# Patient Record
Sex: Male | Born: 1960 | Hispanic: No | Marital: Married | Smoking: Never smoker
Health system: Southern US, Community
[De-identification: ages and names within clinical notes are randomized; demographics above are authoritative.]

## PROBLEM LIST (undated history)

## (undated) DIAGNOSIS — N400 Enlarged prostate without lower urinary tract symptoms: Secondary | ICD-10-CM

## (undated) DIAGNOSIS — N529 Male erectile dysfunction, unspecified: Secondary | ICD-10-CM

## (undated) DIAGNOSIS — Z125 Encounter for screening for malignant neoplasm of prostate: Secondary | ICD-10-CM

## (undated) DIAGNOSIS — R002 Palpitations: Secondary | ICD-10-CM

## (undated) DIAGNOSIS — R011 Cardiac murmur, unspecified: Secondary | ICD-10-CM

## (undated) HISTORY — DX: Cardiac murmur, unspecified: R01.1

## (undated) HISTORY — PX: NASAL POLYP EXCISION: SHX2068

## (undated) HISTORY — DX: Palpitations: R00.2

## (undated) HISTORY — DX: Benign prostatic hyperplasia without lower urinary tract symptoms: N40.0

## (undated) HISTORY — DX: Male erectile dysfunction, unspecified: N52.9

## (undated) HISTORY — DX: Encounter for screening for malignant neoplasm of prostate: Z12.5

---

## 2004-05-11 ENCOUNTER — Ambulatory Visit (HOSPITAL_BASED_OUTPATIENT_CLINIC_OR_DEPARTMENT_OTHER): Admission: RE | Admit: 2004-05-11 | Discharge: 2004-05-11 | Payer: Self-pay | Admitting: *Deleted

## 2004-05-11 ENCOUNTER — Ambulatory Visit (HOSPITAL_COMMUNITY): Admission: RE | Admit: 2004-05-11 | Discharge: 2004-05-11 | Payer: Self-pay | Admitting: *Deleted

## 2012-10-19 ENCOUNTER — Encounter: Payer: Self-pay | Admitting: Internal Medicine

## 2012-11-09 ENCOUNTER — Encounter: Payer: Self-pay | Admitting: Internal Medicine

## 2012-11-09 ENCOUNTER — Ambulatory Visit (INDEPENDENT_AMBULATORY_CARE_PROVIDER_SITE_OTHER): Payer: 59 | Admitting: Internal Medicine

## 2012-11-09 ENCOUNTER — Ambulatory Visit: Payer: 59

## 2012-11-09 VITALS — BP 122/66 | HR 70 | Temp 97.9°F | Resp 16 | Ht 71.5 in | Wt 163.5 lb

## 2012-11-09 DIAGNOSIS — Z0000 Encounter for general adult medical examination without abnormal findings: Secondary | ICD-10-CM

## 2012-11-09 DIAGNOSIS — K921 Melena: Secondary | ICD-10-CM

## 2012-11-09 DIAGNOSIS — I341 Nonrheumatic mitral (valve) prolapse: Secondary | ICD-10-CM

## 2012-11-09 DIAGNOSIS — M546 Pain in thoracic spine: Secondary | ICD-10-CM

## 2012-11-09 DIAGNOSIS — I2510 Atherosclerotic heart disease of native coronary artery without angina pectoris: Secondary | ICD-10-CM

## 2012-11-09 LAB — LIPID PANEL
Cholesterol: 204 mg/dL — ABNORMAL HIGH (ref 0–200)
HDL: 51 mg/dL (ref >39)
Total CHOL/HDL Ratio: 4 Ratio
Triglycerides: 86 mg/dL (ref <150)
VLDL: 17 mg/dL (ref 0–40)

## 2012-11-09 LAB — COMPREHENSIVE METABOLIC PANEL
AST: 22 U/L (ref 0–37)
Albumin: 4.8 g/dL (ref 3.5–5.2)
Alkaline Phosphatase: 82 U/L (ref 39–117)
BUN: 15 mg/dL (ref 6–23)
Creat: 0.85 mg/dL (ref 0.50–1.35)
Glucose, Bld: 97 mg/dL (ref 70–99)
Potassium: 4.2 mEq/L (ref 3.5–5.3)
Total Bilirubin: 0.9 mg/dL (ref 0.3–1.2)

## 2012-11-09 LAB — PSA: PSA: 2.33 ng/mL (ref <=4.00)

## 2012-11-09 LAB — CBC WITH DIFFERENTIAL/PLATELET
Basophils Absolute: 0 10*3/uL (ref 0.0–0.1)
Basophils Relative: 0 % (ref 0–1)
Eosinophils Absolute: 0.1 10*3/uL (ref 0.0–0.7)
HCT: 42.7 % (ref 39.0–52.0)
MCH: 33.3 pg (ref 26.0–34.0)
MCHC: 35.4 g/dL (ref 30.0–36.0)
Monocytes Absolute: 0.6 10*3/uL (ref 0.1–1.0)
Monocytes Relative: 6 % (ref 3–12)
Neutro Abs: 7.6 10*3/uL (ref 1.7–7.7)
RDW: 13 % (ref 11.5–15.5)

## 2012-11-09 LAB — POCT URINALYSIS DIPSTICK
Bilirubin, UA: NEGATIVE
Blood, UA: NEGATIVE
Leukocytes, UA: NEGATIVE
Nitrite, UA: NEGATIVE
Protein, UA: NEGATIVE
pH, UA: 8.5

## 2012-11-09 LAB — POCT UA - MICROSCOPIC ONLY
Casts, Ur, LPF, POC: NEGATIVE
Crystals, Ur, HPF, POC: NEGATIVE
Yeast, UA: NEGATIVE

## 2012-11-09 NOTE — Progress Notes (Signed)
Subjective:    Patient ID: Dillon Morgan, male    DOB: Sep 17, 1961, 51 y.o.   MRN: [id number]  HPI Feels good, needs cpe and never had colonoscopy. Has an chalazion needs removal Has occ mid thorax pain when he wakes up in am, goes away with activity. Wife says he cant hear.   Review of Systems  Constitutional: Negative.   HENT: Negative.   Eyes: Negative.   Respiratory: Negative.   Cardiovascular: Negative.   Gastrointestinal: Negative.   Genitourinary: Negative.   Musculoskeletal: Negative.   Neurological: Negative.   Hematological: Negative.   Psychiatric/Behavioral: Negative.        Objective:   Physical Exam  Vitals reviewed. Constitutional: He is oriented to person, place, and time. He appears well-developed and well-nourished.  HENT:  Right Ear: External ear normal.  Left Ear: External ear normal.  Nose: Nose normal.  Mouth/Throat: Oropharynx is clear and moist.  Eyes: EOM are normal. Pupils are equal, round, and reactive to light. Right eye exhibits hordeolum. No scleral icterus.  Neck: Normal range of motion. No tracheal deviation present. No thyromegaly present.  Cardiovascular: Normal rate, regular rhythm and normal pulses.   No murmur heard.      Has click  Pulmonary/Chest: Effort normal.  Abdominal: Soft. He exhibits no mass. There is no tenderness.  Genitourinary: Rectum normal, prostate normal and penis normal.  Musculoskeletal: Normal range of motion.  Lymphadenopathy:    He has no cervical adenopathy.  Neurological: He is alert and oriented to person, place, and time. He has normal reflexes.  Skin: Skin is warm and dry.  Psychiatric: He has a normal mood and affect. His behavior is normal.   Results for orders placed in visit on 11/09/12  POCT UA - MICROSCOPIC ONLY      Component Value Range   WBC, Ur, HPF, POC 0-2     RBC, urine, microscopic 0-2     Bacteria, U Microscopic neg     Mucus, UA large     Epithelial cells, urine per micros 0-5     Crystals, Ur, HPF, POC neg     Casts, Ur, LPF, POC neg     Yeast, UA neg    POCT URINALYSIS DIPSTICK      Component Value Range   Color, UA yellow     Clarity, UA clear     Glucose, UA neg     Bilirubin, UA neg     Ketones, UA neg     Spec Grav, UA 1.020     Blood, UA neg     pH, UA 8.5     Protein, UA neg     Urobilinogen, UA 0.2     Nitrite, UA neg     Leukocytes, UA Negative     [facility] reading (PRIMARY) by  Dr.Keven Osborn cxr appears normal, ?cause of thorax back pain  Results for orders placed in visit on 11/09/12  IFOBT (OCCULT BLOOD)      Component Value Range   IFOBT Positive    POCT UA - MICROSCOPIC ONLY      Component Value Range   WBC, Ur, HPF, POC 0-2     RBC, urine, microscopic 0-2     Bacteria, U Microscopic neg     Mucus, UA large     Epithelial cells, urine per micros 0-5     Crystals, Ur, HPF, POC neg     Casts, Ur, LPF, POC neg     Yeast, UA neg  POCT URINALYSIS DIPSTICK      Component Value Range   Color, UA yellow     Clarity, UA clear     Glucose, UA neg     Bilirubin, UA neg     Ketones, UA neg     Spec Grav, UA 1.020     Blood, UA neg     pH, UA 8.5     Protein, UA neg     Urobilinogen, UA 0.2     Nitrite, UA neg     Leukocytes, UA Negative     Heme pos.       Assessment & Plan:  Colonocopy ordered Refuses flu shot RF to opthalmology for eyelid lesion

## 2012-11-09 NOTE — Patient Instructions (Addendum)
Chalazion A chalazion is a swelling or hard lump on the eyelid caused by a blocked oil gland. Chalazions may occur on the upper or the lower eyelid.  CAUSES  Oil gland in the eyelid becomes blocked. SYMPTOMS   Swelling or hard lump on the eyelid. This lump may make it hard to see out of the eye.  The swelling may spread to areas around the eye. TREATMENT   Although some chalazions disappear by themselves in 1 or 2 months, some chalazions may need to be removed.  Medicines to treat an infection may be required. HOME CARE INSTRUCTIONS   Wash your hands often and dry them with a clean towel. Do not touch the chalazion.  Apply heat to the eyelid several times a day for 10 minutes to help ease discomfort and bring any yellowish white fluid (pus) to the surface. One way to apply heat to a chalazion is to use the handle of a metal spoon.  Hold the handle under hot water until it is hot, and then wrap the handle in paper towels so that the heat can come through without burning your skin.  Hold the wrapped handle against the chalazion and reheat the spoon handle as needed.  Apply heat in this fashion for 10 minutes, 4 times per day.  Return to your caregiver to have the pus removed if it does not break (rupture) on its own.  Do not try to remove the pus yourself by squeezing the chalazion or sticking it with a pin or needle.  Only take over-the-counter or prescription medicines for pain, discomfort, or fever as directed by your caregiver. SEEK IMMEDIATE MEDICAL CARE IF:   You have pain in your eye.  Your vision changes.  The chalazion does not go away.  The chalazion becomes painful, red, or swollen, grows larger, or does not start to disappear after 2 weeks. MAKE SURE YOU:   Understand these instructions.  Will watch your condition.  Will get help right away if you are not doing well or get worse. Document Released: 11/29/2000 Document Revised: 02/24/2012 Document Reviewed:  03/19/2010 [facility] Patient Information 2013 [place], [place]. Rectal Bleeding Rectal bleeding is when blood passes out of the anus. It is usually a sign that something is wrong. It may not be serious, but it should always be evaluated. Rectal bleeding may present as bright red blood or extremely dark stools. The color may range from dark red or maroon to black (like tar). It is important that the cause of rectal bleeding be identified so treatment can be started and the problem corrected. CAUSES   Hemorrhoids. These are enlarged (dilated) blood vessels or veins in the anal or rectal area.  Fistulas. Theseare abnormal, burrowing channels that usually run from inside the rectum to the skin around the anus. They can bleed.  Anal fissures. This is a tear in the tissue of the anus. Bleeding occurs with bowel movements.  Diverticulosis. This is a condition in which pockets or sacs project from the bowel wall. Occasionally, the sacs can bleed.  Diverticulitis. Thisis an infection involving diverticulosis of the colon.  Proctitis and colitis. These are conditions in which the rectum, colon, or both, can become inflamed and pitted (ulcerated).  Polyps and cancer. Polyps are non-cancerous (benign) growths in the colon that may bleed. Certain types of polyps turn into cancer.  Protrusion of the rectum. Part of the rectum can project from the anus and bleed.  Certain medicines.  Intestinal infections.  Blood vessel  abnormalities. HOME CARE INSTRUCTIONS  Eat a high-fiber diet to keep your stool soft.  Limit activity.  Drink enough fluids to keep your urine clear or pale yellow.  Warm baths may be useful to soothe rectal pain.  Follow up with your caregiver as directed. SEEK IMMEDIATE MEDICAL CARE IF:  You develop increased bleeding.  You have black or dark red stools.  You vomit blood or material that looks like coffee grounds.  You have abdominal pain or tenderness.  You have a  fever.  You feel weak, nauseous, or you faint.  You have severe rectal pain or you are unable to have a bowel movement. MAKE SURE YOU:  Understand these instructions.  Will watch your condition.  Will get help right away if you are not doing well or get worse. Document Released: 05/24/2002 Document Revised: 02/24/2012 Document Reviewed: 05/19/2011 [facility] Patient Information 2013 [place], [place].

## 2012-11-10 ENCOUNTER — Encounter: Payer: Self-pay | Admitting: Internal Medicine

## 2012-12-03 ENCOUNTER — Encounter: Payer: Self-pay | Admitting: Internal Medicine

## 2012-12-11 ENCOUNTER — Encounter: Payer: Self-pay | Admitting: Internal Medicine

## 2012-12-11 ENCOUNTER — Ambulatory Visit (INDEPENDENT_AMBULATORY_CARE_PROVIDER_SITE_OTHER): Payer: 59 | Admitting: Internal Medicine

## 2012-12-11 VITALS — BP 100/64 | HR 68 | Ht 71.5 in | Wt 161.4 lb

## 2012-12-11 DIAGNOSIS — Z8709 Personal history of other diseases of the respiratory system: Secondary | ICD-10-CM | POA: Insufficient documentation

## 2012-12-11 DIAGNOSIS — R195 Other fecal abnormalities: Secondary | ICD-10-CM

## 2012-12-11 DIAGNOSIS — K625 Hemorrhage of anus and rectum: Secondary | ICD-10-CM

## 2012-12-11 DIAGNOSIS — Z1211 Encounter for screening for malignant neoplasm of colon: Secondary | ICD-10-CM

## 2012-12-11 MED ORDER — PEG-KCL-NACL-NASULF-NA ASC-C 100 G PO SOLR: 1.0000 | Freq: Once | ORAL | Status: DC

## 2012-12-11 NOTE — Patient Instructions (Addendum)
You have been scheduled for a colonoscopy with propofol. Please follow written instructions given to you at your visit today.  Please pick up your prep kit at the pharmacy within the next 1-3 days. If you use inhalers (even only as needed) or a CPAP machine, please bring them with you on the day of your procedure.  We have sent the following medications to your pharmacy for you to pick up at your convenience: Moviprep; you were given instructions at your office visit today.

## 2012-12-11 NOTE — Progress Notes (Signed)
Patient ID: Dillon Morgan, male   DOB: May 30, 1961, 51 y.o.   MRN: [id number]  SUBJECTIVE: HPI This or other is a 51 year old male with little past medical history who is seen in consultation at the request of Dr. Perrin Maltese for evaluation of rectal bleeding and screening colonoscopy. The patient is alone today. The patient reports that he had an episode of bright red blood per rectum in August 2013. This was an isolated event and painless. He has seen prior streaks of red blood in his stool prior to the episode of more large volume rectal bleeding. He has not seen subsequent blood in his stool, but was found to have a positive FOBT by his PCP. He reports a long history of what he felt to be IBS. He reports in his 29s he was having one bowel movement per week, and then over the last 12 years he reported a cycle of intermittent looser stools with periods of harder stools. He did add lactobacillus in yogurt to his diet which she reports helped, and then a formal probiotic which has helped significantly. He reports his bowel habits are now regular happening 3-4 times per week without hard stool or diarrhea. He denies abdominal pain. Reports a good appetite and stable weight. No heartburn, dysphagia or odynophagia. He has never had colonoscopy.  Review of Systems  As per history of present illness, otherwise negative   History reviewed. No pertinent past medical history.  Current Outpatient Prescriptions  Medication Sig Dispense Refill  . Probiotic Product (PROBIOTIC DAILY) CAPS Take by mouth every other day.      . peg 3350 powder (MOVIPREP) 100 G SOLR Take 1 kit (100 g total) by mouth once.  1 kit  0    Allergies  Allergen Reactions  . Ampicillin Rash    Family History  Problem Relation Age of Onset  . Heart disease Paternal Grandfather   . Heart disease Paternal Uncle     History  Substance Use Topics  . Smoking status: Never Smoker   . Smokeless tobacco: Never Used  . Alcohol Use: No     OBJECTIVE: BP 100/64  Pulse 68  Ht 5' 11.5" (1.816 m)  Wt 161 lb 6.4 oz (73.211 kg)  BMI 22.20 kg/m2 Constitutional: Well-developed and well-nourished. No distress. HEENT: Normocephalic and atraumatic. Oropharynx is clear and moist. No oropharyngeal exudate. Conjunctivae are normal. No scleral icterus. Neck: Neck supple. Trachea midline. Cardiovascular: Normal rate, regular rhythm and intact distal pulses. No M/R/G Pulmonary/chest: Effort normal and breath sounds normal. No wheezing, rales or rhonchi. Abdominal: Soft, nontender, nondistended. Bowel sounds active throughout. There are no masses palpable. No hepatosplenomegaly. Extremities: no clubbing, cyanosis, or edema Lymphadenopathy: No cervical adenopathy noted. Neurological: Alert and oriented to person place and time. Skin: Skin is warm and dry. No rashes noted. Psychiatric: Normal mood and affect. Behavior is normal.  Labs and Imaging -- CBC    Component Value Date/Time   WBC 10.2 11/09/2012 0900   RBC 4.54 11/09/2012 0900   HGB 15.1 11/09/2012 0900   HCT 42.7 11/09/2012 0900   PLT 206 11/09/2012 0900   MCV 94.1 11/09/2012 0900   MCH 33.3 11/09/2012 0900   MCHC 35.4 11/09/2012 0900   RDW 13.0 11/09/2012 0900   LYMPHSABS 1.8 11/09/2012 0900   MONOABS 0.6 11/09/2012 0900   EOSABS 0.1 11/09/2012 0900   BASOSABS 0.0 11/09/2012 0900      ASSESSMENT AND PLAN: 51 year old male with little past medical history who is seen in consultation  at the request of Dr. Perrin Maltese for evaluation of rectal bleeding and screening colonoscopy.   1.  Rectal bleeding/CRC screening/+FOBT -- I recommended colonoscopy to better evaluate his symptoms, we discussed the test including the risks and benefits in detail and he is agreeable to proceed. His rectal bleeding certainly could be anorectal in nature such as hemorrhoids, but warrants further investigation. It seems that he is not having ongoing bleeding and more normal bowel habits on  probiotic. He will continue probiotic, and further recommendations to be made after colonoscopy.

## 2013-01-29 ENCOUNTER — Encounter: Payer: 59 | Admitting: Internal Medicine

## 2013-02-08 ENCOUNTER — Ambulatory Visit (AMBULATORY_SURGERY_CENTER): Payer: 59 | Admitting: Internal Medicine

## 2013-02-08 ENCOUNTER — Encounter: Payer: Self-pay | Admitting: Internal Medicine

## 2013-02-08 VITALS — BP 117/69 | HR 70 | Temp 96.8°F | Resp 14 | Ht 73.5 in | Wt 161.0 lb

## 2013-02-08 DIAGNOSIS — Z1211 Encounter for screening for malignant neoplasm of colon: Secondary | ICD-10-CM

## 2013-02-08 MED ORDER — SODIUM CHLORIDE 0.9 % IV SOLN: 500.0000 mL | INTRAVENOUS | Status: DC

## 2013-02-08 NOTE — Progress Notes (Signed)
Patient did not experience any of the following events: a burn prior to discharge; a fall within the facility; wrong site/side/patient/procedure/implant event; or a hospital transfer or hospital admission upon discharge from the facility. (G8907)Patient did not have preoperative order for IV antibiotic SSI prophylaxis. (G8918) ewm 

## 2013-02-08 NOTE — Op Note (Signed)
[facility] [address] [place], [address]   COLONOSCOPY PROCEDURE REPORT  PATIENT: Dillon, Morgan  MR#: [id number] BIRTHDATE: 10-15-1961 , 51  yrs. old GENDER: Male ENDOSCOPIST: Beverley Fiedler, MD REFERRED AO:ZHYQM, Chris PROCEDURE DATE:  02/08/2013 PROCEDURE:   Colonoscopy, screening ASA CLASS:   Class II INDICATIONS:average risk screening, Rectal Bleeding, and first colonoscopy. MEDICATIONS: MAC sedation, administered by CRNA, Propofol (Diprivan), Fentanyl-Quick Pick, and Propofol (Diprivan) 330 mg IV   DESCRIPTION OF PROCEDURE:   After the risks benefits and alternatives of the procedure were thoroughly explained, informed consent was obtained.  A digital rectal exam revealed no rectal mass.   The LB [id number]-[id number]  endoscope was introduced through the anus and advanced to the terminal ileum which was intubated for a short distance. No adverse events experienced.   The quality of the prep was Moviprep fair, clearing to good with copious irrigation and lavage.  The instrument was then slowly withdrawn as the colon was fully examined.    COLON FINDINGS: The mucosa appeared normal in the terminal ileum. The colon was redundant.  Manual abdominal counter-pressure and changing to the pediatric colonoscope facilitated intubation to the cecum.   A normal appearing cecum, ileocecal valve, and appendiceal orifice were identified.  The ascending, hepatic flexure, transverse, splenic flexure, descending, sigmoid colon and rectum appeared unremarkable.  No polyps or cancers were seen. Retroflexed views revealed no abnormalities other than skin tags raising the suspicion of prior hemorrhoids.       The scope was withdrawn and the procedure completed.  COMPLICATIONS: There were no complications.  ENDOSCOPIC IMPRESSION: 1.   Normal mucosa in the terminal ileum 2.   The colon was redundant; but normal mucosa throughout the colon  RECOMMENDATIONS: You should  continue to follow colorectal cancer screening guidelines for "routine risk" patients with a repeat colonoscopy in 10 years. There is no need for FOBT (stool) testing for at least 5 years.   eSigned:  Beverley Fiedler, MD 02/08/2013 11:31 AM   cc: Robert Bellow, MD and The Patient

## 2013-02-08 NOTE — Patient Instructions (Addendum)
YOU HAD AN ENDOSCOPIC PROCEDURE TODAY AT THE [facility]: Refer to the procedure report that was given to you for any specific questions about what was found during the examination.  If the procedure report does not answer your questions, please call your gastroenterologist to clarify.  If you requested that your care partner not be given the details of your procedure findings, then the procedure report has been included in a sealed envelope for you to review at your convenience later.  YOU SHOULD EXPECT: Some feelings of bloating in the abdomen. Passage of more gas than usual.  Walking can help get rid of the air that was put into your GI tract during the procedure and reduce the bloating. If you had a lower endoscopy (such as a colonoscopy or flexible sigmoidoscopy) you may notice spotting of blood in your stool or on the toilet paper. If you underwent a bowel prep for your procedure, then you may not have a normal bowel movement for a few days.  DIET: Your first meal following the procedure should be a light meal and then it is ok to progress to your normal diet.  A half-sandwich or bowl of soup is an example of a good first meal.  Heavy or fried foods are harder to digest and may make you feel nauseous or bloated.  Likewise meals heavy in dairy and vegetables can cause extra gas to form and this can also increase the bloating.  Drink plenty of fluids but you should avoid alcoholic beverages for 24 hours.  ACTIVITY: Your care partner should take you home directly after the procedure.  You should plan to take it easy, moving slowly for the rest of the day.  You can resume normal activity the day after the procedure however you should NOT DRIVE or use heavy machinery for 24 hours (because of the sedation medicines used during the test).    SYMPTOMS TO REPORT IMMEDIATELY: A gastroenterologist can be reached at any hour.  During normal business hours, 8:30 AM to 5:00 PM Monday through Friday,  call [phone].  After hours and on weekends, please call the GI answering service at [phone] emergency number  who will take a message and have the physician on call contact you.   Following lower endoscopy (colonoscopy or flexible sigmoidoscopy):  Excessive amounts of blood in the stool  Significant tenderness or worsening of abdominal pains  Swelling of the abdomen that is new, acute  Fever of 100F or higher  FOLLOW UP: If any biopsies were taken you will be contacted by phone or by letter within the next 1-3 weeks.  Call your gastroenterologist if you have not heard about the biopsies in 3 weeks.  Our staff will call the home number listed on your records the next business day following your procedure to check on you and address any questions or concerns that you may have at that time regarding the information given to you following your procedure. This is a courtesy call and so if there is no answer at the home number and we have not heard from you through the emergency physician on call, we will assume that you have returned to your regular daily activities without incident.  SIGNATURES/CONFIDENTIALITY: You and/or your care partner have signed paperwork which will be entered into your electronic medical record.  These signatures attest to the fact that that the information above on your After Visit Summary has been reviewed and is understood.  Full responsibility of the   confidentiality of this discharge information lies with you and/or your care-partner.  Repeat colon 10 years-2024

## 2013-02-09 ENCOUNTER — Telehealth: Payer: Self-pay | Admitting: *Deleted

## 2013-02-09 NOTE — Telephone Encounter (Signed)
  Follow up Call-  Call back number 02/08/2013  Post procedure Call Back phone  # [phone]  Permission to leave phone message Yes     Patient questions:  Do you have a fever, pain , or abdominal swelling? no Pain Score  0 *  Have you tolerated food without any problems? yes  Have you been able to return to your normal activities? yes  Do you have any questions about your discharge instructions: Diet   no Medications  no Follow up visit  no  Do you have questions or concerns about your Care? no  Actions: * If pain score is 4 or above: No action needed, pain <4.

## 2013-09-14 IMAGING — CR DG CHEST 2V
3 series · 3 of 3 positions shown · non-contrast
Comparison: None.

CLINICAL DATA: History of thoracic back pain.

CHEST - 2 VIEW

[PA (1 of 2)]
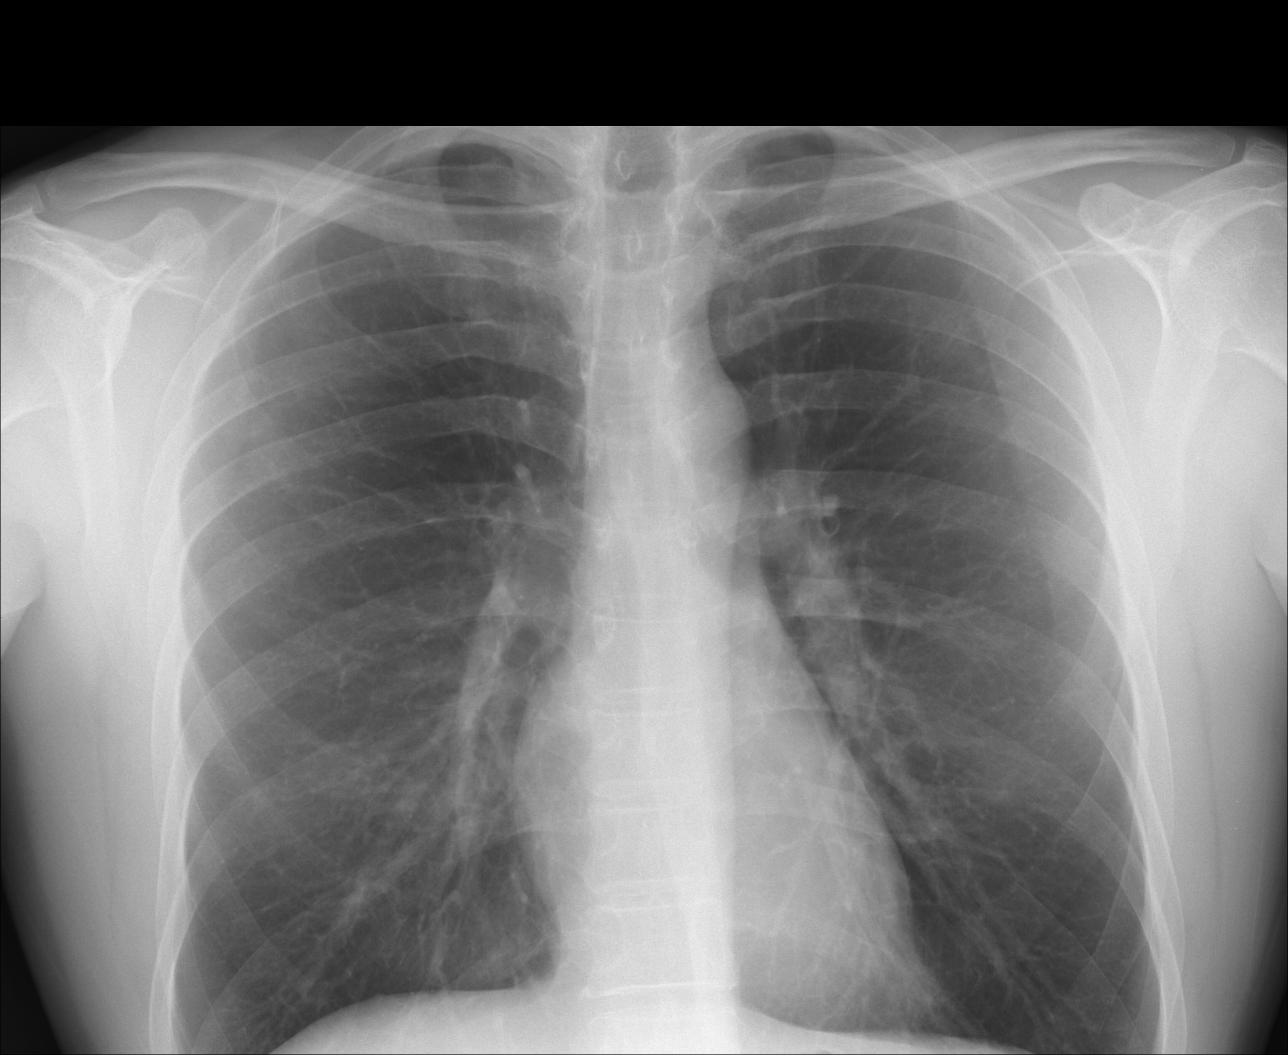

[lateral]
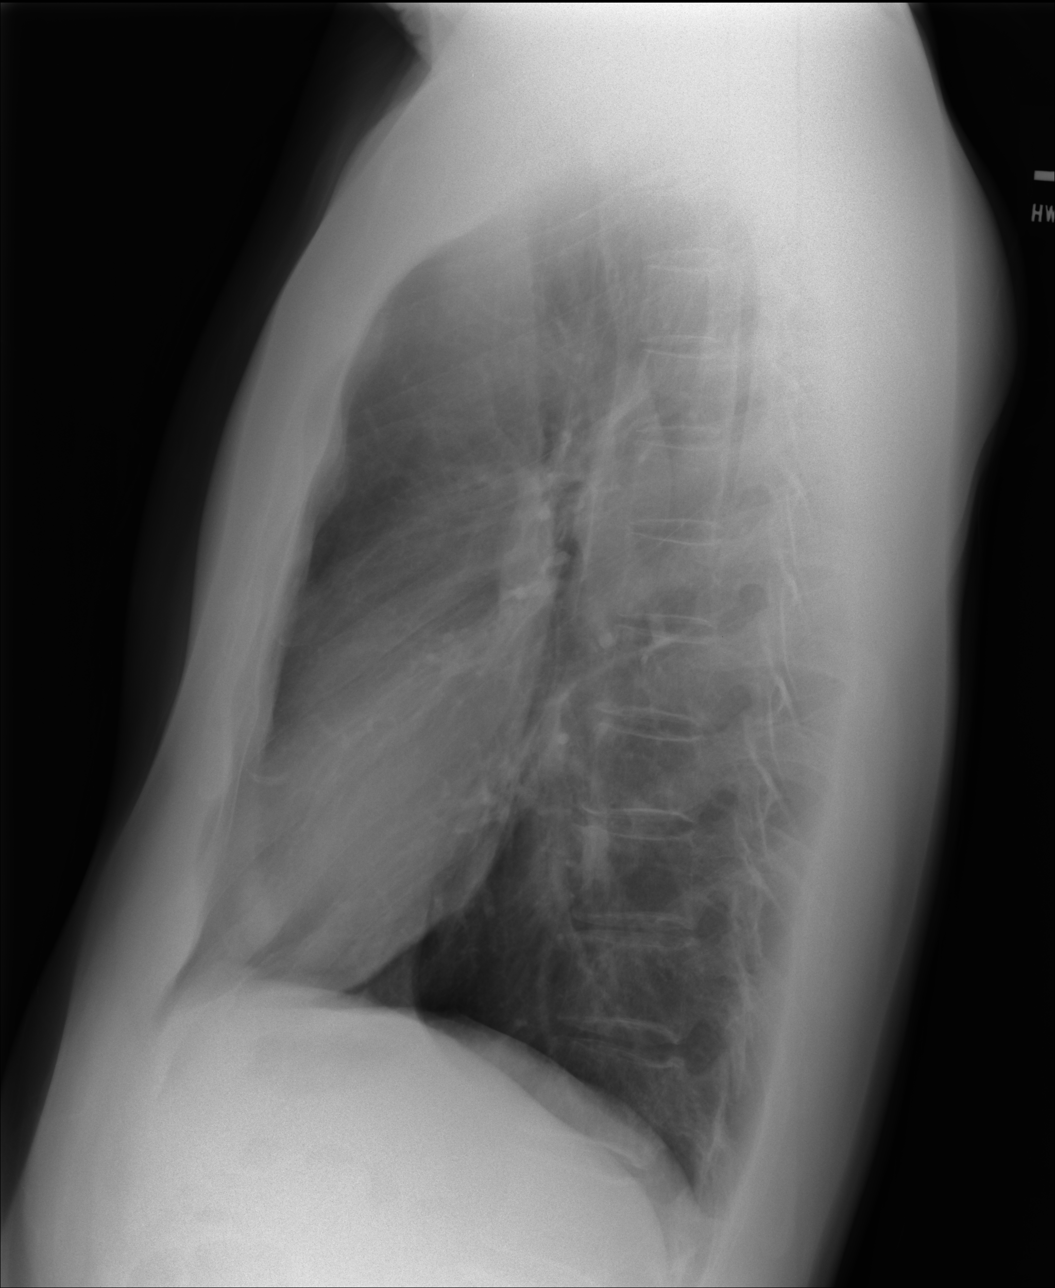

[PA (2 of 2)]
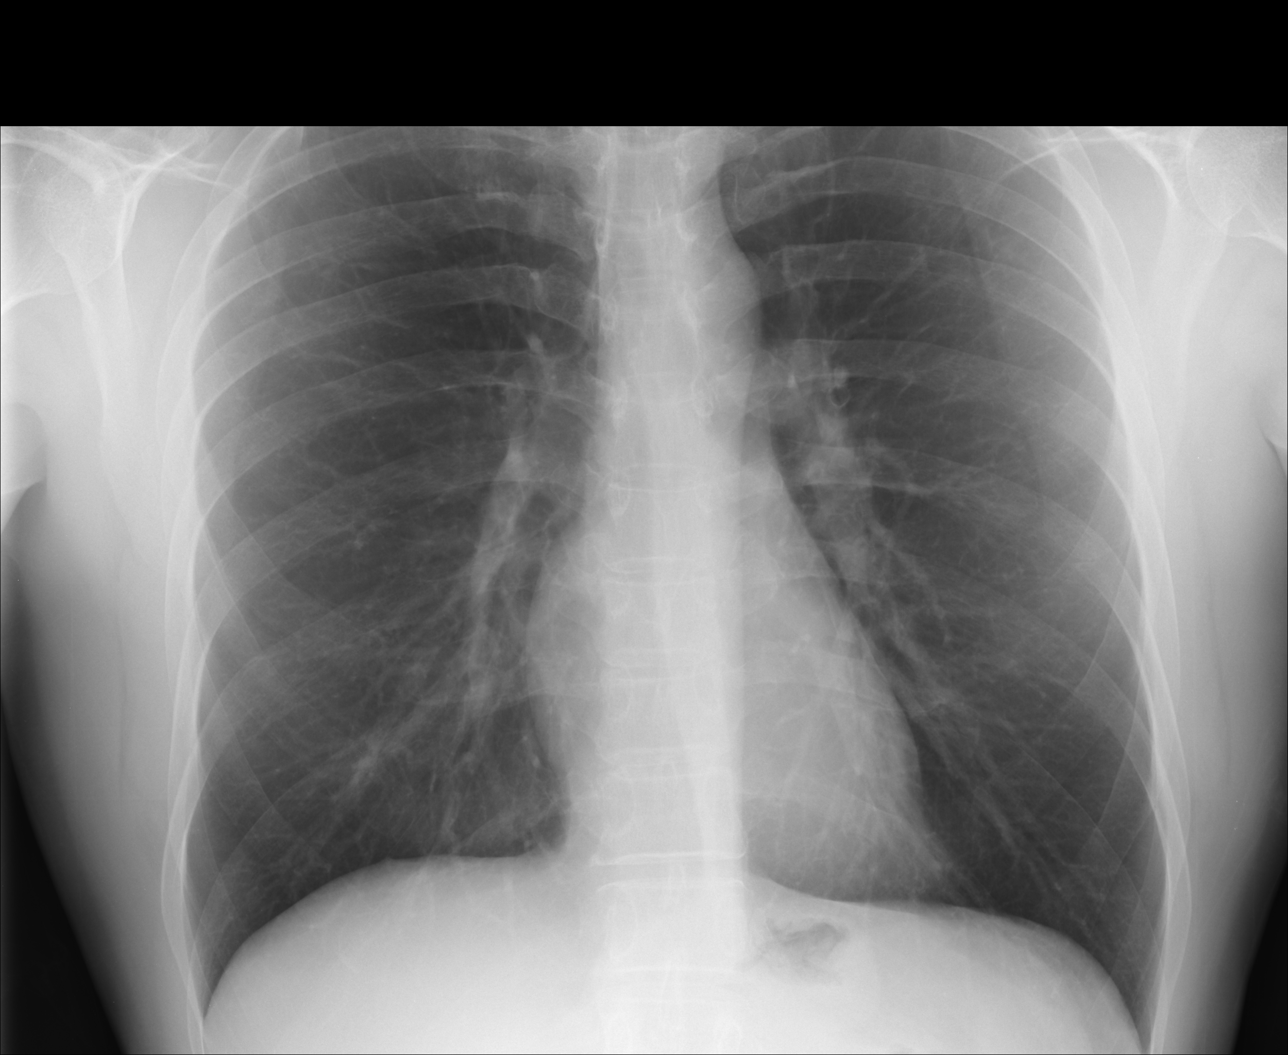

[3 of 3 positions shown; findings below may reference images not displayed]

FINDINGS: Cardiac silhouette is normal size and shape.  No
mediastinal or hilar lesion is evident.  No pulmonary infiltrates
or masses were evident.  No pneumothorax is seen. No pleural
abnormality is evident.  The diaphragm is low-lying with slight
flattening.  This may reflect the patient's body habitus but I
cannot exclude slight hyperinflation.  No skeletal lesions are
identified.
IMPRESSION: Low-lying diaphragm.  This may reflect body habitus.  Slight
hyperinflation cannot be excluded.  No pulmonary infiltrates or
masses are seen.  No skeletal lesions were evident.

## 2015-03-27 ENCOUNTER — Ambulatory Visit (INDEPENDENT_AMBULATORY_CARE_PROVIDER_SITE_OTHER): Payer: BLUE CROSS/BLUE SHIELD | Admitting: Urgent Care

## 2015-03-27 VITALS — BP 98/60 | HR 80 | Temp 99.1°F | Resp 16 | Ht 71.65 in | Wt 161.4 lb

## 2015-03-27 DIAGNOSIS — R509 Fever, unspecified: Secondary | ICD-10-CM

## 2015-03-27 DIAGNOSIS — J101 Influenza due to other identified influenza virus with other respiratory manifestations: Secondary | ICD-10-CM | POA: Diagnosis not present

## 2015-03-27 LAB — POCT INFLUENZA A/B
Influenza A, POC: POSITIVE
Influenza B, POC: NEGATIVE

## 2015-03-27 MED ORDER — OSELTAMIVIR PHOSPHATE 75 MG PO CAPS: 75.0000 mg | ORAL_CAPSULE | Freq: Two times a day (BID) | ORAL | Status: AC

## 2015-03-27 NOTE — Progress Notes (Signed)
    MRN: [id number] DOB: January 18, 1961  Subjective:   Dillon Morgan is a 54 y.o. male presenting for chief complaint of Fever; Chills; Nausea; Sinus Drainage; and Cough  Reports 1 day history of Sinus congestion, rhinorrhea, throat pain, cough, myalgia and sinus headache, subjective fever. Has tried ibuprofen, robitussin with some relief. Denies itchy watery eyes, red eyes, ear fullness, ear pain, wheezing, shortness of breath, chest tightness and chest pain, fatigue, nausea, vomiting, abdominal pain and diarrhea. Has had no sick contacts. Denies history of seasonal allergies, history of asthma. Patient did not take flu shot this season. No smoking or alcohol. Denies any other aggravating or relieving factors, no other questions or concerns.  Dillon Morgan has a current medication list which includes the following prescription(s): probiotic daily. She is allergic to ampicillin.  Dillon Morgan  has a past medical history of Heart palpitations. Also  has past surgical history that includes Nasal polyp excision.  ROS As in subjective.  Objective:   Vitals: BP 98/60 mmHg  Pulse 80  Temp(Src) 99.1 F (37.3 C) (Oral)  Resp 16  Ht 5' 11.65" (1.82 m)  Wt 161 lb 6 oz (73.199 kg)  BMI 22.10 kg/m2  SpO2 95%  Physical Exam  Constitutional: He is well-developed, well-nourished, and in no distress.  HENT:  TM's flat bilaterally, no effusions or erythema. Nasal turbinates pink and moist with yellow mucus. No sinus tenderness. Postnasal drip present, without oropharyngeal exudates, erythema or abscesses.  Eyes: Conjunctivae are normal. Right eye exhibits no discharge. Left eye exhibits no discharge. No scleral icterus.  Neck: Normal range of motion.  Cardiovascular: Normal rate, regular rhythm and intact distal pulses.  Exam reveals no gallop and no friction rub.   No murmur heard. Pulmonary/Chest: No stridor. No respiratory distress. He has no wheezes. He has no rales. He exhibits no tenderness.  Lymphadenopathy:      He has no cervical adenopathy.  Skin: Skin is warm and dry. No rash noted. No erythema. No pallor.   Results for orders placed or performed in visit on 03/27/15 (from the past 24 hour(s))  POCT Influenza A/B     Status: None   Collection Time: 03/27/15  8:28 PM  Result Value Ref Range   Influenza A, POC Positive    Influenza B, POC Negative    Assessment and Plan :   1. Chills with fever 2. Influenza A - Start oseltamivir, ibuprofen or tylenol for fever, myalgias, rest and hydration - Return to clinic if symptoms fail to resolve in 1 week  Wallis BambergMario Nelson Noone, PA-C [facility] [phone] 03/27/2015 8:18 PM

## 2015-03-27 NOTE — Patient Instructions (Signed)

## 2017-02-05 DIAGNOSIS — J101 Influenza due to other identified influenza virus with other respiratory manifestations: Secondary | ICD-10-CM | POA: Diagnosis not present

## 2017-02-05 DIAGNOSIS — R509 Fever, unspecified: Secondary | ICD-10-CM | POA: Diagnosis not present

## 2017-02-11 DIAGNOSIS — J019 Acute sinusitis, unspecified: Secondary | ICD-10-CM | POA: Diagnosis not present

## 2017-06-27 DIAGNOSIS — R05 Cough: Secondary | ICD-10-CM | POA: Diagnosis not present

## 2017-09-04 DIAGNOSIS — H40013 Open angle with borderline findings, low risk, bilateral: Secondary | ICD-10-CM | POA: Diagnosis not present

## 2017-09-04 DIAGNOSIS — H5213 Myopia, bilateral: Secondary | ICD-10-CM | POA: Diagnosis not present

## 2017-09-22 DIAGNOSIS — N5201 Erectile dysfunction due to arterial insufficiency: Secondary | ICD-10-CM | POA: Diagnosis not present

## 2017-09-23 LAB — PSA: PSA: 2.89

## 2017-10-13 ENCOUNTER — Ambulatory Visit: Payer: Self-pay | Admitting: Family Medicine

## 2017-10-14 ENCOUNTER — Other Ambulatory Visit: Payer: Self-pay | Admitting: *Deleted

## 2017-10-14 ENCOUNTER — Encounter: Payer: Self-pay | Admitting: *Deleted

## 2017-10-14 ENCOUNTER — Encounter: Payer: Self-pay | Admitting: Internal Medicine

## 2017-10-14 ENCOUNTER — Telehealth: Payer: Self-pay | Admitting: *Deleted

## 2017-10-14 NOTE — Telephone Encounter (Signed)
New pt apt on 10/23/17.  Received medical records from [facility].  Reviewed records and abstracted information into pts chart.   Records placed on Dr. Samul DadaMcGowen's desk for review.

## 2017-10-20 ENCOUNTER — Ambulatory Visit: Payer: Self-pay | Admitting: Family Medicine

## 2017-10-23 ENCOUNTER — Encounter: Payer: Self-pay | Admitting: Family Medicine

## 2017-10-23 ENCOUNTER — Ambulatory Visit: Payer: Self-pay | Admitting: Family Medicine

## 2018-02-19 DIAGNOSIS — E7800 Pure hypercholesterolemia, unspecified: Secondary | ICD-10-CM | POA: Diagnosis not present

## 2018-02-19 DIAGNOSIS — Z0000 Encounter for general adult medical examination without abnormal findings: Secondary | ICD-10-CM | POA: Diagnosis not present

## 2018-03-24 DIAGNOSIS — R946 Abnormal results of thyroid function studies: Secondary | ICD-10-CM | POA: Diagnosis not present

## 2018-03-24 DIAGNOSIS — B351 Tinea unguium: Secondary | ICD-10-CM | POA: Diagnosis not present

## 2018-09-22 DIAGNOSIS — R946 Abnormal results of thyroid function studies: Secondary | ICD-10-CM | POA: Diagnosis not present

## 2018-09-22 DIAGNOSIS — B351 Tinea unguium: Secondary | ICD-10-CM | POA: Diagnosis not present

## 2018-09-22 DIAGNOSIS — B354 Tinea corporis: Secondary | ICD-10-CM | POA: Diagnosis not present

## 2018-12-29 DIAGNOSIS — R21 Rash and other nonspecific skin eruption: Secondary | ICD-10-CM | POA: Diagnosis not present

## 2019-02-23 DIAGNOSIS — Z0000 Encounter for general adult medical examination without abnormal findings: Secondary | ICD-10-CM | POA: Diagnosis not present

## 2019-02-23 DIAGNOSIS — Z125 Encounter for screening for malignant neoplasm of prostate: Secondary | ICD-10-CM | POA: Diagnosis not present

## 2019-02-23 DIAGNOSIS — E7800 Pure hypercholesterolemia, unspecified: Secondary | ICD-10-CM | POA: Diagnosis not present

## 2020-02-24 DIAGNOSIS — R946 Abnormal results of thyroid function studies: Secondary | ICD-10-CM | POA: Diagnosis not present

## 2020-02-24 DIAGNOSIS — Z0000 Encounter for general adult medical examination without abnormal findings: Secondary | ICD-10-CM | POA: Diagnosis not present

## 2020-02-24 DIAGNOSIS — E7800 Pure hypercholesterolemia, unspecified: Secondary | ICD-10-CM | POA: Diagnosis not present

## 2020-02-24 DIAGNOSIS — Z125 Encounter for screening for malignant neoplasm of prostate: Secondary | ICD-10-CM | POA: Diagnosis not present

## 2020-09-12 DIAGNOSIS — R319 Hematuria, unspecified: Secondary | ICD-10-CM | POA: Diagnosis not present

## 2020-09-12 DIAGNOSIS — N342 Other urethritis: Secondary | ICD-10-CM | POA: Diagnosis not present

## 2021-02-28 DIAGNOSIS — E7800 Pure hypercholesterolemia, unspecified: Secondary | ICD-10-CM | POA: Diagnosis not present

## 2021-02-28 DIAGNOSIS — Z0000 Encounter for general adult medical examination without abnormal findings: Secondary | ICD-10-CM | POA: Diagnosis not present

## 2021-02-28 DIAGNOSIS — Z125 Encounter for screening for malignant neoplasm of prostate: Secondary | ICD-10-CM | POA: Diagnosis not present

## 2021-02-28 DIAGNOSIS — R946 Abnormal results of thyroid function studies: Secondary | ICD-10-CM | POA: Diagnosis not present

## 2022-03-07 DIAGNOSIS — Z125 Encounter for screening for malignant neoplasm of prostate: Secondary | ICD-10-CM | POA: Diagnosis not present

## 2022-03-07 DIAGNOSIS — Z0000 Encounter for general adult medical examination without abnormal findings: Secondary | ICD-10-CM | POA: Diagnosis not present

## 2022-03-07 DIAGNOSIS — E7800 Pure hypercholesterolemia, unspecified: Secondary | ICD-10-CM | POA: Diagnosis not present

## 2022-03-07 DIAGNOSIS — R946 Abnormal results of thyroid function studies: Secondary | ICD-10-CM | POA: Diagnosis not present

## 2023-04-03 DIAGNOSIS — Z125 Encounter for screening for malignant neoplasm of prostate: Secondary | ICD-10-CM | POA: Diagnosis not present

## 2023-04-03 DIAGNOSIS — E7800 Pure hypercholesterolemia, unspecified: Secondary | ICD-10-CM | POA: Diagnosis not present

## 2023-04-03 DIAGNOSIS — Z0000 Encounter for general adult medical examination without abnormal findings: Secondary | ICD-10-CM | POA: Diagnosis not present

## 2023-04-03 DIAGNOSIS — R946 Abnormal results of thyroid function studies: Secondary | ICD-10-CM | POA: Diagnosis not present

## 2023-04-09 DIAGNOSIS — Z1211 Encounter for screening for malignant neoplasm of colon: Secondary | ICD-10-CM | POA: Diagnosis not present

## 2024-07-08 DIAGNOSIS — Z125 Encounter for screening for malignant neoplasm of prostate: Secondary | ICD-10-CM | POA: Diagnosis not present

## 2024-07-08 DIAGNOSIS — E7800 Pure hypercholesterolemia, unspecified: Secondary | ICD-10-CM | POA: Diagnosis not present

## 2024-07-08 DIAGNOSIS — R946 Abnormal results of thyroid function studies: Secondary | ICD-10-CM | POA: Diagnosis not present

## 2024-07-08 DIAGNOSIS — N529 Male erectile dysfunction, unspecified: Secondary | ICD-10-CM | POA: Diagnosis not present

## 2024-07-08 DIAGNOSIS — Z0000 Encounter for general adult medical examination without abnormal findings: Secondary | ICD-10-CM | POA: Diagnosis not present

## 2024-07-12 DIAGNOSIS — Z1211 Encounter for screening for malignant neoplasm of colon: Secondary | ICD-10-CM | POA: Diagnosis not present

## 2024-09-14 DIAGNOSIS — R972 Elevated prostate specific antigen [PSA]: Secondary | ICD-10-CM | POA: Diagnosis not present
